# Patient Record
Sex: Female | Born: 1965 | Race: White | Hispanic: No | Marital: Single | Smoking: Former smoker
Health system: Southern US, Community
[De-identification: ages and names within clinical notes are randomized; demographics above are authoritative.]

## PROBLEM LIST (undated history)

## (undated) DIAGNOSIS — F319 Bipolar disorder, unspecified: Secondary | ICD-10-CM

## (undated) DIAGNOSIS — F329 Major depressive disorder, single episode, unspecified: Secondary | ICD-10-CM

## (undated) DIAGNOSIS — F32A Depression, unspecified: Secondary | ICD-10-CM

## (undated) HISTORY — PX: BACK SURGERY: SHX140

---
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
## 2005-10-30 ENCOUNTER — Emergency Department: Payer: Self-pay | Admitting: Emergency Medicine

## 2006-06-04 ENCOUNTER — Emergency Department: Payer: Self-pay | Admitting: Emergency Medicine

## 2007-02-01 ENCOUNTER — Emergency Department: Payer: Self-pay | Admitting: Emergency Medicine

## 2011-01-29 ENCOUNTER — Emergency Department: Payer: Self-pay | Admitting: Emergency Medicine

## 2011-07-13 ENCOUNTER — Ambulatory Visit: Payer: Self-pay

## 2011-08-01 ENCOUNTER — Ambulatory Visit: Payer: Self-pay | Admitting: Obstetrics and Gynecology

## 2011-08-02 ENCOUNTER — Ambulatory Visit: Payer: Self-pay | Admitting: Obstetrics and Gynecology

## 2011-08-05 LAB — PATHOLOGY REPORT

## 2011-08-17 ENCOUNTER — Encounter: Payer: Self-pay | Admitting: Family Medicine

## 2011-09-01 ENCOUNTER — Encounter: Payer: Self-pay | Admitting: Family Medicine

## 2012-07-25 IMAGING — CR DG SHOULDER 1V*R*
1 series · 1 of 1 positions shown · non-contrast
Comparison: none

REASON FOR EXAM: degenerated spine scoliosis neck pain, shoulder pain
COMMENTS:

PROCEDURE:     DXR - DXR SHOULDER RIGHT ONE VIEW  - July 13, 2011 [DATE]
RESULT:     No fracture, dislocation or other acute bony abnormality is
identified.

[view not recorded]
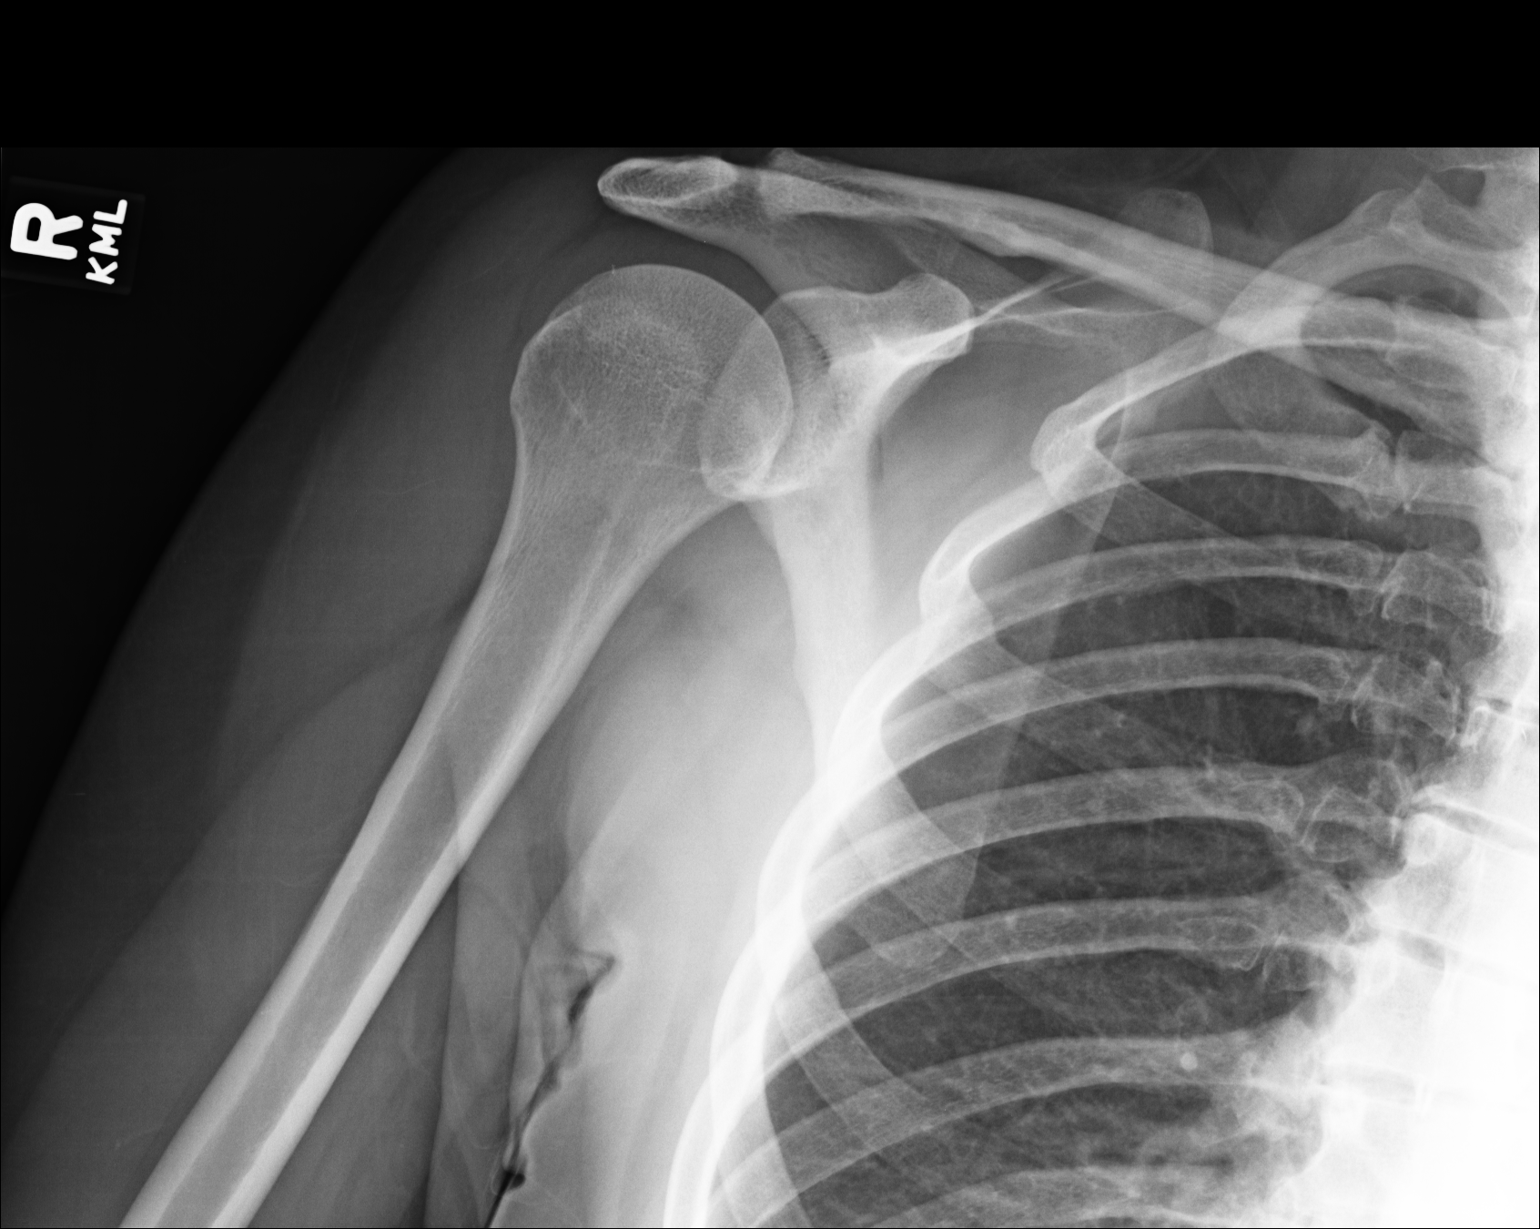

[1 of 1 positions shown; findings below may reference images not displayed]

IMPRESSION: No acute changes are identified.

## 2012-07-25 IMAGING — CR DG LUMBAR SPINE 2-3V
1 series · 3 of 3 positions shown · non-contrast
Comparison: none

REASON FOR EXAM: degenerated spine scoliosis neck pain, shoulder pain
COMMENTS:

[Series 1: view not recorded · 0.17mm/px · 3 of 3 slices shown]
[im 1/3]
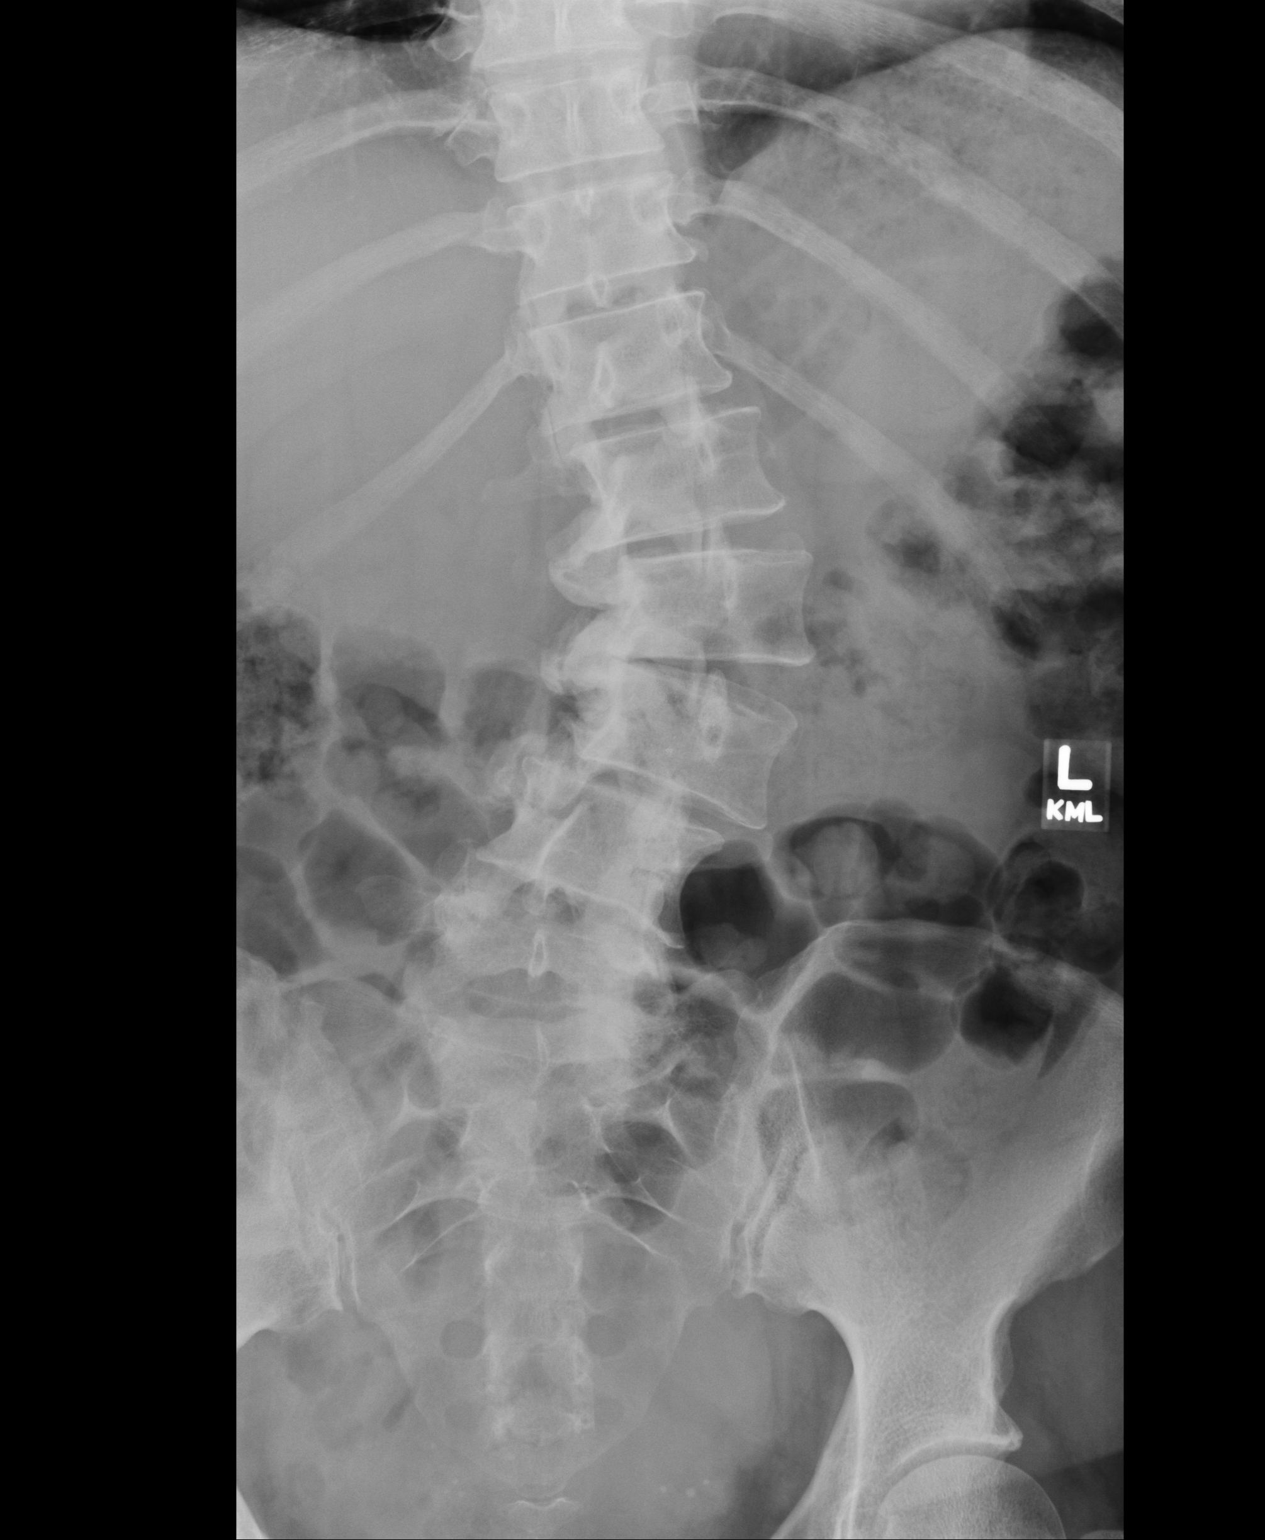
[im 2/3]
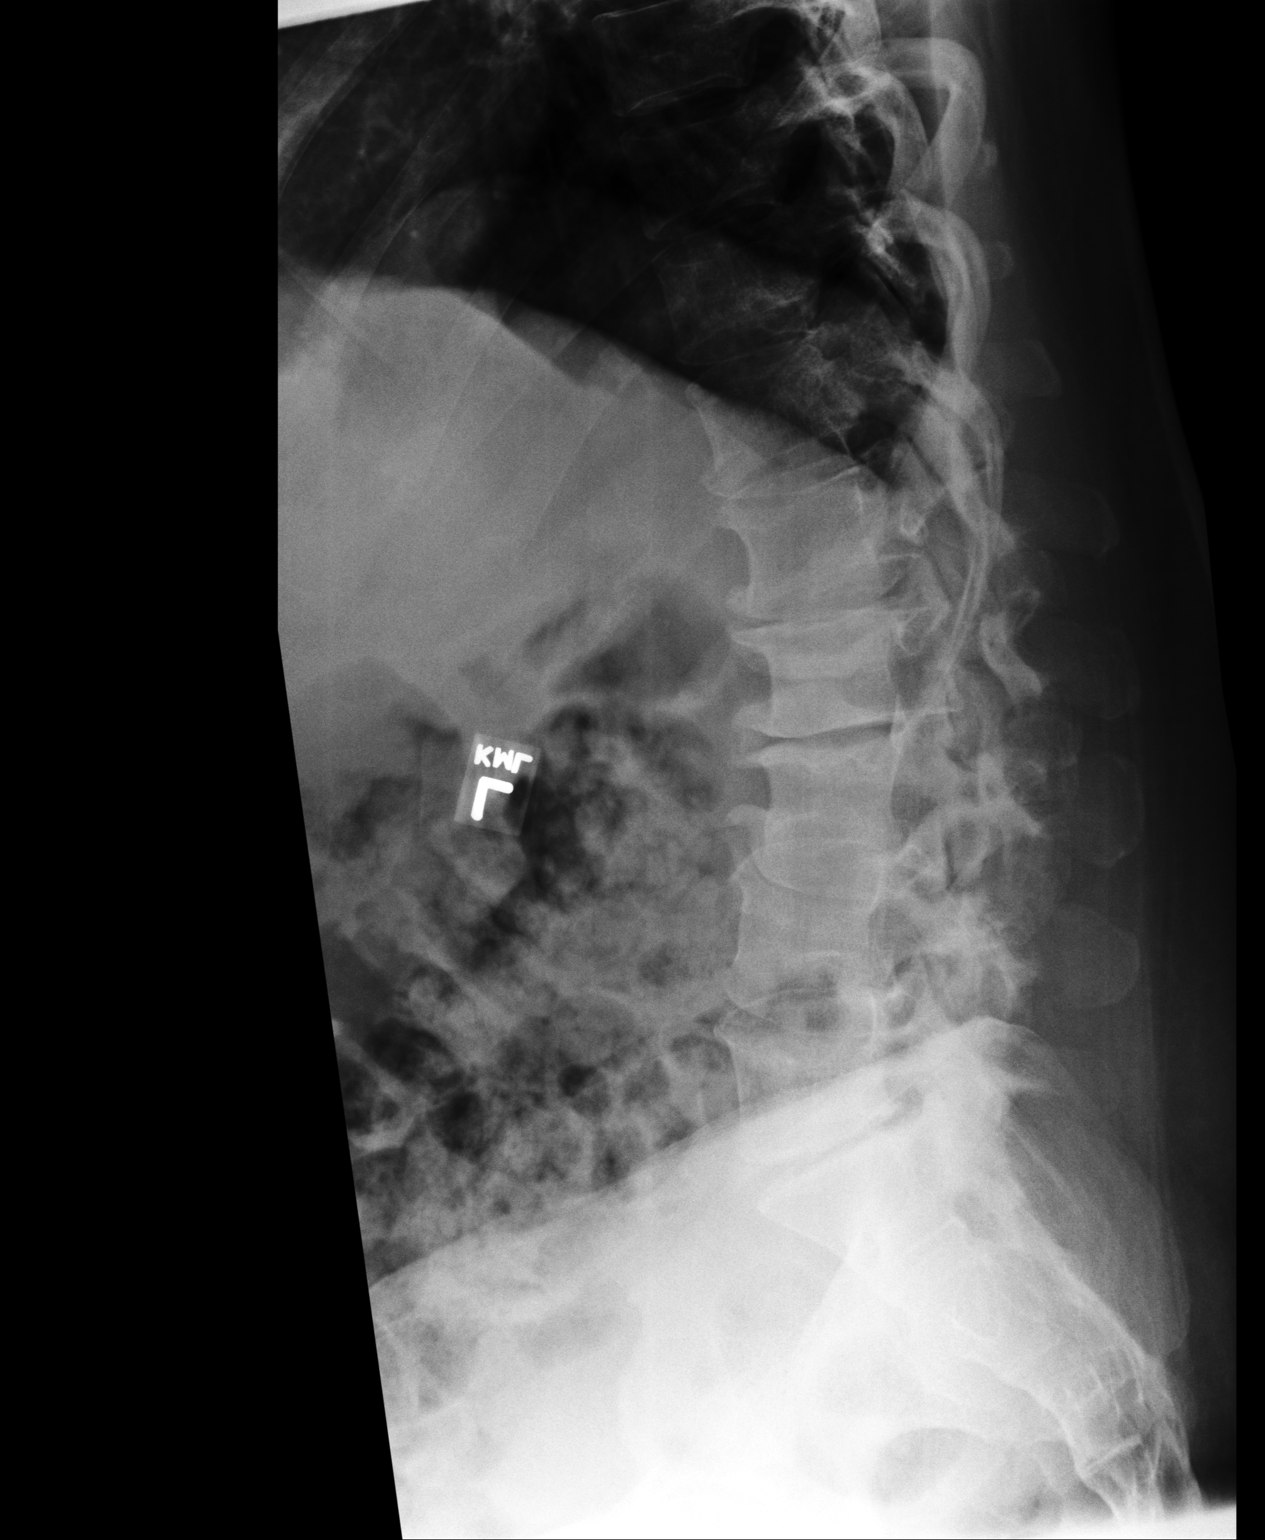
[im 3/3]
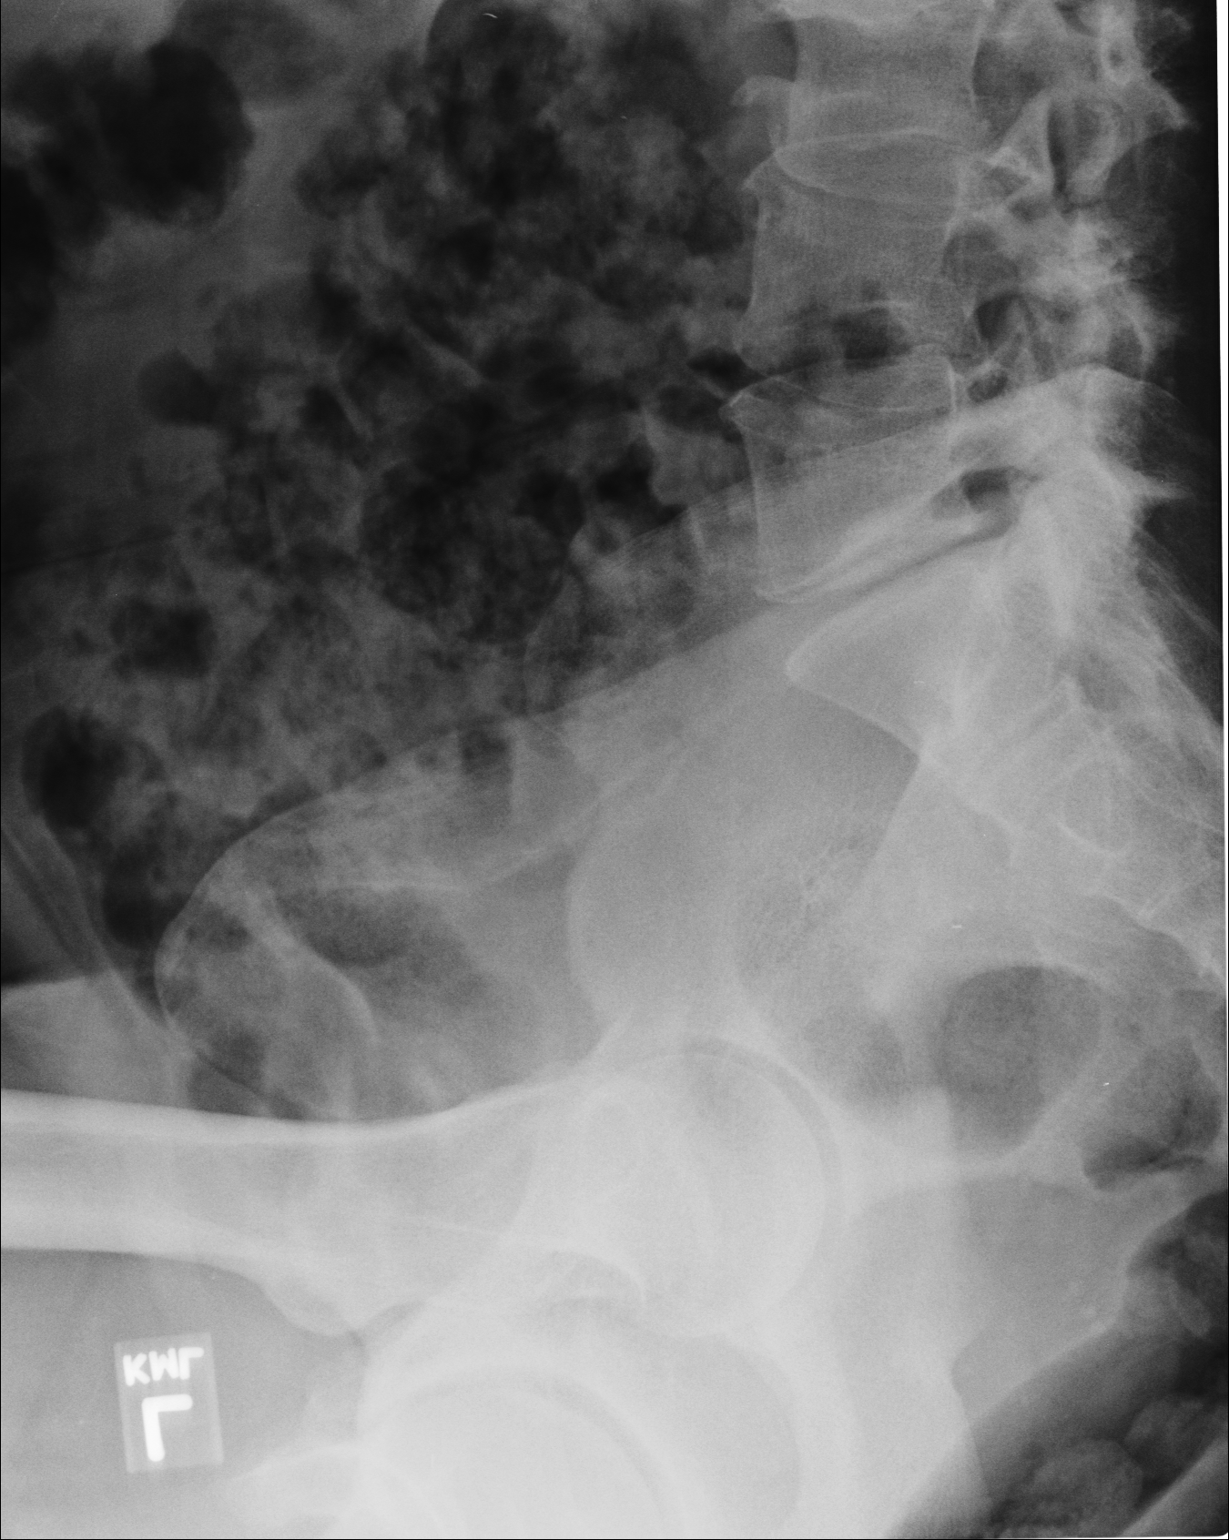

[3 of 3 positions shown; findings below may reference images not displayed]

PROCEDURE:     DXR - DXR LUMBAR SPINE AP AND LATERAL  - July 13, 2011 [DATE]

RESULT:     The vertebral body heights are well-maintained. No fracture is
seen. There is narrowing of multiple intervertebral disc spaces. The disc
space narrowing is at least in part secondary to the lumbar scoliosis that
is present. Additionally, degenerative disc disease may also be present at
multiple levels, particularly at L2-L3 and at L5-S1. This could be further
evaluated by MR if clinically indicated. No lytic or blastic lesions are
seen. The patient's lumbar scoliosis measures approximately 38 degrees and
has a convexity to the left.
IMPRESSION: 1. No fracture is seen.
2. There is a 38 degree lumbar scoliosis.
3. There is narrowing of multiple intervertebral disc spaces, most prominent
at L2-L3 and at L5-S1.

## 2013-08-30 ENCOUNTER — Emergency Department: Payer: Self-pay | Admitting: Internal Medicine

## 2017-07-05 ENCOUNTER — Ambulatory Visit: Payer: Self-pay | Attending: Oncology

## 2017-08-23 ENCOUNTER — Ambulatory Visit: Payer: Self-pay | Attending: Oncology

## 2019-05-08 ENCOUNTER — Emergency Department
Admission: EM | Admit: 2019-05-08 | Discharge: 2019-05-08 | Disposition: A | Discharge status: Home / Self Care | Payer: Self-pay | Attending: Emergency Medicine | Admitting: Emergency Medicine

## 2019-05-08 ENCOUNTER — Encounter: Payer: Self-pay | Admitting: Emergency Medicine

## 2019-05-08 ENCOUNTER — Other Ambulatory Visit: Payer: Self-pay

## 2019-05-08 DIAGNOSIS — F332 Major depressive disorder, recurrent severe without psychotic features: Secondary | ICD-10-CM

## 2019-05-08 DIAGNOSIS — R45851 Suicidal ideations: Secondary | ICD-10-CM | POA: Insufficient documentation

## 2019-05-08 DIAGNOSIS — R4689 Other symptoms and signs involving appearance and behavior: Secondary | ICD-10-CM

## 2019-05-08 DIAGNOSIS — F17210 Nicotine dependence, cigarettes, uncomplicated: Secondary | ICD-10-CM | POA: Insufficient documentation

## 2019-05-08 HISTORY — DX: Depression, unspecified: F32.A

## 2019-05-08 HISTORY — DX: Bipolar disorder, unspecified: F31.9

## 2019-05-08 LAB — URINE DRUG SCREEN, QUALITATIVE (ARMC ONLY)
Amphetamines, Ur Screen: NOT DETECTED
Barbiturates, Ur Screen: NOT DETECTED
Benzodiazepine, Ur Scrn: NOT DETECTED
Cannabinoid 50 Ng, Ur ~~LOC~~: NOT DETECTED
Cocaine Metabolite,Ur ~~LOC~~: NOT DETECTED
MDMA (Ecstasy)Ur Screen: NOT DETECTED
Methadone Scn, Ur: NOT DETECTED
Opiate, Ur Screen: NOT DETECTED
Phencyclidine (PCP) Ur S: NOT DETECTED
Tricyclic, Ur Screen: NOT DETECTED

## 2019-05-08 LAB — COMPREHENSIVE METABOLIC PANEL
ALT: 17 U/L (ref 0–44)
AST: 24 U/L (ref 15–41)
Albumin: 4.7 g/dL (ref 3.5–5.0)
Alkaline Phosphatase: 69 U/L (ref 38–126)
Anion gap: 8 (ref 5–15)
BUN: 7 mg/dL (ref 6–20)
CO2: 26 mmol/L (ref 22–32)
Calcium: 9.2 mg/dL (ref 8.9–10.3)
Chloride: 105 mmol/L (ref 98–111)
Creatinine, Ser: 0.73 mg/dL (ref 0.44–1.00)
GFR calc Af Amer: >60 mL/min (ref >60)
GFR calc non Af Amer: >60 mL/min (ref >60)
Glucose, Bld: 90 mg/dL (ref 70–99)
Potassium: 3.6 mmol/L (ref 3.5–5.1)
Sodium: 139 mmol/L (ref 135–145)
Total Bilirubin: 0.4 mg/dL (ref 0.3–1.2)
Total Protein: 7.5 g/dL (ref 6.5–8.1)

## 2019-05-08 LAB — CBC
HCT: 44.9 % (ref 36.0–46.0)
Hemoglobin: 15.5 g/dL — ABNORMAL HIGH (ref 12.0–15.0)
MCH: 32.4 pg (ref 26.0–34.0)
MCHC: 34.5 g/dL (ref 30.0–36.0)
MCV: 93.9 fL (ref 80.0–100.0)
Platelets: 334 10*3/uL (ref 150–400)
RBC: 4.78 MIL/uL (ref 3.87–5.11)
RDW: 11.8 % (ref 11.5–15.5)
WBC: 9 10*3/uL (ref 4.0–10.5)
nRBC: 0 % (ref 0.0–0.2)

## 2019-05-08 LAB — ACETAMINOPHEN LEVEL: Acetaminophen (Tylenol), Serum: <10 ug/mL — ABNORMAL LOW (ref 10–30)

## 2019-05-08 LAB — SALICYLATE LEVEL: Salicylate Lvl: <7 mg/dL (ref 2.8–30.0)

## 2019-05-08 LAB — ETHANOL: Alcohol, Ethyl (B): <10 mg/dL (ref <10)

## 2019-05-08 MED ORDER — QUETIAPINE FUMARATE 50 MG PO TABS: 50.0000 mg | ORAL_TABLET | Freq: Every day | ORAL | 0 refills | Status: AC

## 2019-05-08 NOTE — Discharge Instructions (Addendum)
Return to the ER for any thoughts of wanting to hurt yourself or anyone else, hearing voices, severe anxiety, or any other acute psychiatric or medical symptoms that concern you.

## 2019-05-08 NOTE — ED Triage Notes (Signed)
Arrives with BPD, IVC.  Patient states she called [facility] and spoke with psychiatry and Education officer, museum.  Patient states she thought she was going to be discharged from [facility] with a RX (seraquel) , but because patient was not able to commit to an assessment time, IVC paperwork was taken out.  Patient states she had som SI and has been feeling overwhelmed emotionally.  States she had a support plan in place.  Denies HI

## 2019-05-08 NOTE — ED Provider Notes (Signed)
[facility] Emergency Department Provider Note ____________________________________________   First MD Initiated Contact with Patient 05/08/19 1751     (approximate)  I have reviewed the triage vital signs and the nursing notes.   HISTORY  Chief Complaint No chief complaint on file.    HPI Annette ChamberlainRolanda M Cobb is a 53 y.o. female with PMH as noted below who presents under involuntary commitment [facility] after she apparently reported suicidal ideation.  However, the patient states that she only admitted to having SI in the past.  She denies any thoughts of wanting to hurt herself or anyone else at this time.  She denies any other acute psychiatric symptoms.  She states that she went to [facility] to have her medications for depression adjusted as she has been feeling slightly more depressed, but was not expecting it to escalate to her being committed.  She denies any acute medical complaints.  Past Medical History:  Diagnosis Date  . Bipolar 1 disorder (HCC)   . Depression     Patient Active Problem List   Diagnosis Date Noted  . MDD (major depressive disorder), recurrent severe, without psychosis (HCC) 05/08/2019    Past Surgical History:  Procedure Laterality Date  . BACK SURGERY      Prior to Admission medications   Not on File    Allergies Chantix [varenicline tartrate] and Doxycycline  No family history on file.  Social History Social History   Tobacco Use  . Smoking status: Current Every Day Smoker    Types: Cigarettes  . Smokeless tobacco: Never Used  Substance Use Topics  . Alcohol use: Not Currently  . Drug use: Not Currently    Review of Systems  Constitutional: No fever. Eyes: No redness. ENT: No sore throat. Cardiovascular: Denies chest pain. Respiratory: Denies shortness of breath. Gastrointestinal: No vomiting or diarrhea.  Genitourinary: Negative for dysuria.  Musculoskeletal: Negative for  back pain. Skin: Negative for rash. Neurological: Negative for headache.   ____________________________________________   PHYSICAL EXAM:  VITAL SIGNS: ED Triage Vitals  Enc Vitals Group     BP 05/08/19 1740 (!) 86/64     Pulse Rate 05/08/19 1740 (!) 116     Resp 05/08/19 1740 16     Temp 05/08/19 1740 98.4 F (36.9 C)     Temp Source 05/08/19 1740 Oral     SpO2 05/08/19 1740 95 %     Weight 05/08/19 1737 211 lb 11.2 oz (96 kg)     Height 05/08/19 1737 5\' 4"  (1.626 m)     Head Circumference --      Peak Flow --      Pain Score 05/08/19 1737 0     Pain Loc --      Pain Edu? --      Excl. in GC? --     Constitutional: Alert and oriented. Well appearing and in no acute distress. Eyes: Conjunctivae are normal.  Head: Atraumatic. Nose: No congestion/rhinnorhea. Mouth/Throat: Mucous membranes are moist.   Neck: Normal range of motion.  Cardiovascular: Good peripheral circulation. Respiratory: Normal respiratory effort.   Gastrointestinal: No distention.  Musculoskeletal: Extremities warm and well perfused.  Neurologic:  Normal speech and language. No gross focal neurologic deficits are appreciated.  Skin:  Skin is warm and dry. No rash noted. Psychiatric: Mood and affect are normal.  Calm, cooperative and appropriate.  ____________________________________________   LABS (all labs ordered are listed, but only abnormal results are displayed)  Labs Reviewed  ACETAMINOPHEN LEVEL - Abnormal; Notable for the following components:      Result Value   Acetaminophen (Tylenol), Serum <10 (*)    All other components within normal limits  CBC - Abnormal; Notable for the following components:   Hemoglobin 15.5 (*)    All other components within normal limits  COMPREHENSIVE METABOLIC PANEL  ETHANOL  SALICYLATE LEVEL  URINE DRUG SCREEN, QUALITATIVE ([facility] ONLY)  POC URINE PREG, ED   ____________________________________________  EKG    ____________________________________________  RADIOLOGY    ____________________________________________   PROCEDURES  Procedure(s) performed: No  Procedures  Critical Care performed: No ____________________________________________   INITIAL IMPRESSION / ASSESSMENT AND PLAN / ED COURSE  Pertinent labs & imaging results that were available during my care of the patient were reviewed by me and considered in my medical decision making (see chart for details).  53 year old female with a history of depression presents under involuntary commitment, referred from [place] for suicidal ideation.  However, the patient states that she was talking about SI in the past and has no active SI or HI.  She has no other acute psychiatric symptoms.  She denies any acute medical symptoms.  On exam she is well-appearing.  At triage her blood pressure was borderline low and she was slightly tachycardic, likely due to anxiety.  She is alert, ambulating without apparent difficulty and is well-appearing.  The remainder of the exam is unremarkable.  At this time, she denies active SI or HI and is able to contract for safety.  She does not demonstrate acute danger to self or others.  I will consult psychiatry.  ----------------------------------------- 7:06 PM on 05/08/2019 -----------------------------------------  She has been evaluated by the psychiatry NP Money.  He advises that the patient does not meet admission criteria at this time.  He is not currently certified to rescind involuntary commitment.  I agree with this assessment as the patient also did not demonstrate any evidence of acute danger to self or others during my evaluation.  He also spoke to the patient's son who agreed.  I will rescind the IVC based on this recommendation.  He also recommended that we start the patient on Seroquel nightly and give her a one-week supply until she can follow-up.  The patient agrees with this plan.  She is stable  for discharge home at this time.  Return precautions given, and she expresses understanding.  ____________________________________________   FINAL CLINICAL IMPRESSION(S) / ED DIAGNOSES  Final diagnoses:  Behavior concern      NEW MEDICATIONS STARTED DURING THIS VISIT:  New Prescriptions   No medications on file     Note:  This document was prepared using Dragon voice recognition software and may include unintentional dictation errors.   Arta Silence, MD 05/08/19 1910

## 2019-05-08 NOTE — ED Notes (Signed)
Pt dressed out by this tech  Pt belongings: Pink Bra Tan underwear Blue jeans [employer] [place] Pack of Marlboro cigarettes 1 Head Band Pair of silver like earrings

## 2019-05-08 NOTE — ED Notes (Signed)
PLEASE CALL PATIENT MICHELLE

## 2019-05-08 NOTE — Consult Note (Signed)
[facility] Face-to[facility]Face Psychiatry Consult   Reason for Consult:  Reported SI Referring Physician:  EDP Patient Identification: Annette Cobb MRN:  [id number] Principal Diagnosis: MDD (major depressive disorder), recurrent severe, without psychosis (HCC) Diagnosis:  Principal Problem:   MDD (major depressive disorder), recurrent severe, without psychosis (HCC)   Total Time spent with patient: 30 minutes  Subjective:   Annette Cobb is a 53 y.o. female patient reports that she went up [facility] today to get some help because she is feeling overwhelmed.  She stated she did report having some passive SI and she was asked specifically well if you were going to do it and then how would you kill yourself and the patient's reported how she would do it.  Patient states that she did not told them that she does not want to kill herself and she has no thoughts of going through with that. Patient reports that she went to [facility] to get help and because she wanted to go smoke a cigarette she was IVCd. She states taht sh eneed resources because she refuses to go back to [facility].  HPI:  Arrives with BPD, IVC.  Patient states she called [facility] and spoke with psychiatry and Child psychotherapistsocial worker.  Patient states she thought she was going to be discharged from [facility] with a RX (seraquel) , but because patient was not able to commit to an assessment time, IVC paperwork was taken out. Patient states she had som SI and has been feeling overwhelmed emotionally.  States she had a support plan in place.  Patient is seen by me face-to-face and I have consulted with Dr. Lucianne MussKumar. Patient does not endorse any suicidal or homicidal ideations and denies any hallucinations. Patient does report vague and passive suicidal ideations in the past but none currently. Patient's son was contacted by CSW and he feels the patient is safe for discharge and there are no weapons in the house. The patient does not meet inpatient criteria and is psychiatrically  cleared. I have contacted Dr. Marisa SeverinSiadecki and he has agreed to rescind the IVC. He also agreed to prescribe Seroquel 50 mg PO QHS for 1 week. CSW is providing the patinet with outpatient resources.  Past Psychiatric History: MDD, Borderline personality disorder  Risk to Self:   Risk to Others:   Prior Inpatient Therapy:   Prior Outpatient Therapy:    Past Medical History:  Past Medical History:  Diagnosis Date  . Bipolar 1 disorder (HCC)   . Depression     Past Surgical History:  Procedure Laterality Date  . BACK SURGERY     Family History: No family history on file. Family Psychiatric  History: Both parents had manic depression Social History:  Social History   Substance and Sexual Activity  Alcohol Use Not Currently     Social History   Substance and Sexual Activity  Drug Use Not Currently    Social History   Socioeconomic History  . Marital status: Single    Spouse name: Not on file  . Number of children: Not on file  . Years of education: Not on file  . Highest education level: Not on file  Occupational History  . Not on file  Social Needs  . Financial resource strain: Not on file  . Food insecurity    Worry: Not on file    Inability: Not on file  . Transportation needs    Medical: Not on file    Non-medical: Not on file  Tobacco Use  .  Smoking status: Current Every Day Smoker    Types: Cigarettes  . Smokeless tobacco: Never Used  Substance and Sexual Activity  . Alcohol use: Not Currently  . Drug use: Not Currently  . Sexual activity: Not on file  Lifestyle  . Physical activity    Days per week: Not on file    Minutes per session: Not on file  . Stress: Not on file  Relationships  . Social Herbalist on phone: Not on file    Gets together: Not on file    Attends religious service: Not on file    Active member of club or organization: Not on file    Attends meetings of clubs or organizations: Not on file    Relationship status: Not on  file  Other Topics Concern  . Not on file  Social History Narrative  . Not on file   Additional Social History:    Allergies:   Allergies  Allergen Reactions  . Chantix [Varenicline Tartrate]   . Doxycycline     Labs:  Results for orders placed or performed during the hospital encounter of 05/08/19 (from the past 48 hour(s))  Comprehensive metabolic panel     Status: None   Collection Time: 05/08/19  5:44 PM  Result Value Ref Range   Sodium 139 135 - 145 mmol/L   Potassium 3.6 3.5 - 5.1 mmol/L   Chloride 105 98 - 111 mmol/L   CO2 26 22 - 32 mmol/L   Glucose, Bld 90 70 - 99 mg/dL   BUN 7 6 - 20 mg/dL   Creatinine, Ser 0.73 0.44 - 1.00 mg/dL   Calcium 9.2 8.9 - 10.3 mg/dL   Total Protein 7.5 6.5 - 8.1 g/dL   Albumin 4.7 3.5 - 5.0 g/dL   AST 24 15 - 41 U/L   ALT 17 0 - 44 U/L   Alkaline Phosphatase 69 38 - 126 U/L   Total Bilirubin 0.4 0.3 - 1.2 mg/dL   GFR calc non Af Amer >60 >60 mL/min   GFR calc Af Amer >60 >60 mL/min   Anion gap 8 5 - 15    Comment: Performed at [facility], [address] [place], [place] [address]  Ethanol     Status: None   Collection Time: 05/08/19  5:44 PM  Result Value Ref Range   Alcohol, Ethyl (B) <10 <10 mg/dL    Comment: (NOTE) Lowest detectable limit for serum alcohol is 10 mg/dL. For medical purposes only. Performed at [facility], [address] [place], [place] [address]   Salicylate level     Status: None   Collection Time: 05/08/19  5:44 PM  Result Value Ref Range   Salicylate Lvl <2.6 2.8 - 30.0 mg/dL    Comment: Performed at [facility], [address] [place], [place] [address]  Acetaminophen level     Status: Abnormal   Collection Time: 05/08/19  5:44 PM  Result Value Ref Range   Acetaminophen (Tylenol), Serum <10 (L) 10 - 30 ug/mL    Comment: (NOTE) Therapeutic concentrations vary significantly. A range of 10-30 ug/mL  may be an effective concentration for many patients. However,  some  are best treated at concentrations outside of this range. Acetaminophen concentrations >150 ug/mL at 4 hours after ingestion  and >50 ug/mL at 12 hours after ingestion are often associated with  toxic reactions. Performed at [facility], [address] [place], [place] [address]   cbc  Status: Abnormal   Collection Time: 05/08/19  5:44 PM  Result Value Ref Range   WBC 9.0 4.0 - 10.5 K/uL   RBC 4.78 3.87 - 5.11 MIL/uL   Hemoglobin 15.5 (H) 12.0 - 15.0 g/dL   HCT 16.144.9 09.636.0 - 04.546.0 %   MCV 93.9 80.0 - 100.0 fL   MCH 32.4 26.0 - 34.0 pg   MCHC 34.5 30.0 - 36.0 g/dL   RDW 40.911.8 81.111.5 - 91.415.5 %   Platelets 334 150 - 400 K/uL   nRBC 0.0 0.0 - 0.2 %    Comment: Performed at [facility], [address] [place], [place] [id number]    No current facility-administered medications for this encounter.    No current outpatient medications on file.    Musculoskeletal: Strength & Muscle Tone: within normal limits Gait & Station: normal Patient leans: N/A  Psychiatric Specialty Exam: Physical Exam  Nursing note and vitals reviewed. Constitutional: She is oriented to person, place, and time. She appears well-developed and well-nourished.  Respiratory: Effort normal.  Musculoskeletal: Normal range of motion.  Neurological: She is alert and oriented to person, place, and time.  Skin: Skin is warm.    Review of Systems  Constitutional: Negative.   HENT: Negative.   Eyes: Negative.   Respiratory: Negative.   Cardiovascular: Negative.   Gastrointestinal: Negative.   Genitourinary: Negative.   Musculoskeletal: Negative.   Skin: Negative.   Neurological: Negative.   Endo/Heme/Allergies: Negative.   Psychiatric/Behavioral: Positive for depression. Suicidal ideas: Passive and vague. The patient is nervous/anxious.     Blood pressure (!) 86/64, pulse (!) 116, temperature 98.4 F (36.9 C), temperature source Oral, resp. rate 16, height 5\' 4"  (1.626 m), weight  96 kg, SpO2 95 %.Body mass index is 36.34 kg/m.  General Appearance: Casual  Eye Contact:  Good  Speech:  Clear and Coherent and Normal Rate  Volume:  Normal  Mood:  Depressed  Affect:  Congruent  Thought Process:  Coherent and Descriptions of Associations: Intact  Orientation:  Full (Time, Place, and Person)  Thought Content:  WDL  Suicidal Thoughts:  None currently but reports previous passive SI  Homicidal Thoughts:  No  Memory:  Immediate;   Good Recent;   Good Remote;   Good  Judgement:  Fair  Insight:  Good  Psychomotor Activity:  Normal  Concentration:  Concentration: Good and Attention Span: Good  Recall:  Good  Fund of Knowledge:  Good  Language:  Good  Akathisia:  No  Handed:  Right  AIMS (if indicated):     Assets:  Communication Skills Desire for Improvement Financial Resources/Insurance Housing Resilience Social Support Transportation  ADL's:  Intact  Cognition:  WNL  Sleep:        Treatment Plan Summary: Collateral gained from son and safety plan established  Continue Celexa 60 mg Daily Start Seroquel 50 mg PO QHS may prescribe enough for 1 week Patient must follow up with outpatient provider for continued prescriptions  Disposition: No evidence of imminent risk to self or others at present.   Patient does not meet criteria for psychiatric inpatient admission. Supportive therapy provided about ongoing stressors. Discussed crisis plan, support from social network, calling 911, coming to the Emergency Department, and calling Suicide Hotline.  Gerlene Burdockravis B Money, FNP 05/08/2019 7:00 PM

## 2019-05-08 NOTE — ED Notes (Signed)
Pt. Going home with son. 

## 2019-05-08 NOTE — ED Notes (Addendum)
Patient talking to TTS and psychiatry  

## 2024-10-04 ENCOUNTER — Ambulatory Visit
Admission: EM | Admit: 2024-10-04 | Discharge: 2024-10-04 | Disposition: A | Discharge status: Home / Self Care | Payer: Self-pay | Attending: Family Medicine | Admitting: Family Medicine

## 2024-10-04 ENCOUNTER — Encounter: Payer: Self-pay | Admitting: Emergency Medicine

## 2024-10-04 DIAGNOSIS — J029 Acute pharyngitis, unspecified: Secondary | ICD-10-CM

## 2024-10-04 DIAGNOSIS — R051 Acute cough: Secondary | ICD-10-CM

## 2024-10-04 DIAGNOSIS — J069 Acute upper respiratory infection, unspecified: Secondary | ICD-10-CM

## 2024-10-04 LAB — POC COVID19/FLU A&B COMBO
Covid Antigen, POC: NEGATIVE
Influenza A Antigen, POC: NEGATIVE
Influenza B Antigen, POC: NEGATIVE

## 2024-10-04 LAB — POCT RAPID STREP A (OFFICE): Rapid Strep A Screen: NEGATIVE

## 2024-10-04 MED ORDER — BENZONATATE 100 MG PO CAPS: 100.0000 mg | ORAL_CAPSULE | Freq: Three times a day (TID) | ORAL | 0 refills | Status: AC

## 2024-10-04 NOTE — ED Provider Notes (Signed)
 [facility]    CSN: [id number] Arrival date & time: 10/04/24  0905      History   Chief Complaint Chief Complaint  Patient presents with   Chills   Cough    HPI Annette Cobb is a 59 y.o. female  presents for evaluation of URI symptoms for 3 days. Patient reports associated symptoms of cough, congestion, sore throat, ear pain, body aches, chills. Denies N/V/D, fevers, shortness of breath. Patient does not have a hx of asthma. Patient is an active smoker.   Reports sick contacts via work.  Pt has taken nothing OTC for symptoms. Pt has no other concerns at this time.    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      Home Medications    Prior to Admission medications   Medication Sig Start Date End Date Taking? Authorizing Provider  benzonatate  (TESSALON ) 100 MG capsule Take 1 capsule (100 mg total) by mouth every 8 (eight) hours. 10/04/24  Yes Latosha Gaylord, Jodi R, NP  ARIPiprazole (ABILIFY) 5 MG tablet Take 5 mg by mouth daily.    [provider]  atorvastatin (LIPITOR) 20 MG tablet Take 20 mg by mouth daily.    [provider]  citalopram (CELEXA) 20 MG tablet Take 60 mg by mouth daily.    [provider]  levothyroxine (SYNTHROID) 100 MCG tablet Take 100 mcg by mouth daily.    [provider]  QUEtiapine  (SEROQUEL ) 50 MG tablet Take 1 tablet (50 mg total) by mouth at bedtime for 7 days. 05/08/19 05/15/19  Jacolyn Pae, MD    Family History History reviewed. No pertinent family history.  Social History Social History   Tobacco Use   Smoking status:  Former    Types: Cigarettes   Smokeless tobacco: Never  Vaping Use   Vaping status: Every Day  Substance Use Topics   Alcohol use: Not Currently   Drug use: Not Currently     Allergies   Chantix [varenicline tartrate] and Doxycycline   Review of Systems Review of Systems  Constitutional:  Positive for chills.  HENT:  Positive for congestion and sore throat.   Respiratory:  Positive for cough.   Musculoskeletal:  Positive for myalgias.     Physical Exam Triage Vital Signs ED Triage Vitals  Encounter Vitals Group     BP 10/04/24 0935 124/71     Girls Systolic BP Percentile --      Girls Diastolic BP Percentile --      Boys Systolic BP Percentile --      Boys Diastolic BP Percentile --      Pulse Rate 10/04/24 0935 99     Resp 10/04/24 0935 15     Temp 10/04/24 0935 98.6 F (37 C)     Temp Source 10/04/24 0935 Oral     SpO2 10/04/24 0935 96 %     Weight 10/04/24 0932 211 lb 10.3 oz (96 kg)     Height 10/04/24 0932 5' 4 (1.626 m)     Head Circumference --  Peak Flow --      Pain Score 10/04/24 0931 5     Pain Loc --      Pain Education --      Exclude from Growth Chart --    No data found.  Updated Vital Signs BP 124/71 (BP Location: Left Arm)   Pulse 99   Temp 98.6 F (37 C) (Oral)   Resp 15   Ht 5' 4 (1.626 m)   Wt 211 lb 10.3 oz (96 kg)   SpO2 96%   BMI 36.33 kg/m   Visual Acuity Right Eye Distance:   Left Eye Distance:   Bilateral Distance:    Right Eye Near:   Left Eye Near:    Bilateral Near:     Physical Exam Vitals and nursing note reviewed.  Constitutional:      General: She is not in acute distress.    Appearance: She is well-developed. She is not ill-appearing.  HENT:     Head: Normocephalic and atraumatic.     Right Ear: Tympanic membrane and ear canal normal.     Left Ear: Tympanic membrane and ear canal normal.     Nose: Congestion present.     Mouth/Throat:     Mouth: Mucous membranes are moist.     Pharynx:  Oropharynx is clear. Uvula midline. Posterior oropharyngeal erythema present.     Tonsils: No tonsillar exudate or tonsillar abscesses.  Eyes:     Conjunctiva/sclera: Conjunctivae normal.     Pupils: Pupils are equal, round, and reactive to light.  Cardiovascular:     Rate and Rhythm: Normal rate and regular rhythm.     Heart sounds: Normal heart sounds.  Pulmonary:     Effort: Pulmonary effort is normal.     Breath sounds: Normal breath sounds. No wheezing, rhonchi or rales.  Musculoskeletal:     Cervical back: Normal range of motion and neck supple.  Lymphadenopathy:     Cervical: No cervical adenopathy.  Skin:    General: Skin is warm and dry.  Neurological:     General: No focal deficit present.     Mental Status: She is alert and oriented to person, place, and time.  Psychiatric:        Mood and Affect: Mood normal.        Behavior: Behavior normal.      UC Treatments / Results  Labs (all labs ordered are listed, but only abnormal results are displayed) Labs Reviewed  POC COVID19/FLU A&B COMBO - Normal  POCT RAPID STREP A (OFFICE) - Normal    EKG   Radiology No results found.  Procedures Procedures (including critical care time)  Medications Ordered in UC Medications - No data to display  Initial Impression / Assessment and Plan / UC Course  I have reviewed the triage vital signs and the nursing notes.  Pertinent labs & imaging results that were available during my care of the patient were reviewed by me and considered in my medical decision making (see chart for details).     Reviewed exam and symptoms with patient.  No red flags.  Negative COVID flu and strep throat testing.  Discussed viral illness and symptomatic treatment.  Tessalon  as needed for cough.  Encouraged rest fluids and PCP follow-up if symptoms do not improve.  ER precautions reviewed. Final Clinical Impressions(s) / UC Diagnoses   Final diagnoses:  Acute cough  Sore throat  Viral  upper respiratory illness     Discharge Instructions  You tested negative for COVID, flu, and strep throat.  Please treat your symptoms with over the counter  tylenol  or ibuprofen, humidifier, and rest.  You may take Tessalon  3 times a day as needed for your cough.  Viral illnesses can last 7-14 days. Please follow up with your PCP if your symptoms are not improving. Please go to the ER for any worsening symptoms. This includes but is not limited to fever you can not control with tylenol  or ibuprofen, you are not able to stay hydrated, you have shortness of breath or chest pain.  Thank you for choosing [facility] for your healthcare needs. I hope you feel better soon!      ED Prescriptions     Medication Sig Dispense Auth. Provider   benzonatate  (TESSALON ) 100 MG capsule Take 1 capsule (100 mg total) by mouth every 8 (eight) hours. 21 capsule Isamar Wellbrock, Jodi R, NP      PDMP not reviewed this encounter.   Loreda Myla SAUNDERS, NP 10/04/24 [phone]

## 2024-10-04 NOTE — ED Triage Notes (Signed)
 Patient c/o cough, chills, and runny nose that started early this morning.  Patient unsure of fevers.

## 2024-10-04 NOTE — Discharge Instructions (Signed)
 You tested negative for COVID, flu, and strep throat.  Please treat your symptoms with over the counter  tylenol  or ibuprofen, humidifier, and rest.  You may take Tessalon  3 times a day as needed for your cough.  Viral illnesses can last 7-14 days. Please follow up with your PCP if your symptoms are not improving. Please go to the ER for any worsening symptoms. This includes but is not limited to fever you can not control with tylenol  or ibuprofen, you are not able to stay hydrated, you have shortness of breath or chest pain.  Thank you for choosing [facility] for your healthcare needs. I hope you feel better soon!
